# Patient Record
Sex: Female | Born: 1965 | Race: Black or African American | Hispanic: No | Marital: Married | Smoking: Never smoker
Health system: Southern US, Community
[De-identification: ages and names within clinical notes are randomized; demographics above are authoritative.]

## PROBLEM LIST (undated history)

## (undated) DIAGNOSIS — I10 Essential (primary) hypertension: Secondary | ICD-10-CM

---

## 1997-04-25 ENCOUNTER — Ambulatory Visit (HOSPITAL_COMMUNITY): Admission: RE | Admit: 1997-04-25 | Discharge: 1997-04-25 | Payer: Self-pay | Admitting: Obstetrics and Gynecology

## 1999-01-01 ENCOUNTER — Other Ambulatory Visit: Admission: RE | Admit: 1999-01-01 | Discharge: 1999-01-01 | Payer: Self-pay | Admitting: Obstetrics & Gynecology

## 1999-08-14 ENCOUNTER — Inpatient Hospital Stay (HOSPITAL_COMMUNITY): Admission: AD | Admit: 1999-08-14 | Discharge: 1999-08-17 | Payer: Self-pay | Admitting: Obstetrics & Gynecology

## 2000-03-30 ENCOUNTER — Other Ambulatory Visit: Admission: RE | Admit: 2000-03-30 | Discharge: 2000-03-30 | Payer: Self-pay | Admitting: Obstetrics and Gynecology

## 2002-11-19 ENCOUNTER — Emergency Department (HOSPITAL_COMMUNITY): Admission: EM | Admit: 2002-11-19 | Discharge: 2002-11-19 | Payer: Self-pay | Admitting: Emergency Medicine

## 2002-11-19 ENCOUNTER — Encounter: Payer: Self-pay | Admitting: Emergency Medicine

## 2004-02-21 ENCOUNTER — Other Ambulatory Visit: Admission: RE | Admit: 2004-02-21 | Discharge: 2004-02-21 | Payer: Self-pay | Admitting: Obstetrics and Gynecology

## 2004-02-21 ENCOUNTER — Other Ambulatory Visit: Admission: RE | Admit: 2004-02-21 | Discharge: 2004-02-21 | Payer: Self-pay | Admitting: Obstetrics & Gynecology

## 2004-02-22 ENCOUNTER — Ambulatory Visit (HOSPITAL_COMMUNITY): Admission: RE | Admit: 2004-02-22 | Discharge: 2004-02-22 | Payer: Self-pay | Admitting: Obstetrics & Gynecology

## 2005-05-11 ENCOUNTER — Other Ambulatory Visit: Admission: RE | Admit: 2005-05-11 | Discharge: 2005-05-11 | Payer: Self-pay | Admitting: Obstetrics & Gynecology

## 2010-07-18 ENCOUNTER — Other Ambulatory Visit: Payer: Self-pay | Admitting: Obstetrics & Gynecology

## 2012-11-30 ENCOUNTER — Other Ambulatory Visit: Payer: Self-pay

## 2017-02-01 ENCOUNTER — Encounter (HOSPITAL_COMMUNITY): Payer: Self-pay | Admitting: Family Medicine

## 2017-02-01 ENCOUNTER — Emergency Department (HOSPITAL_COMMUNITY): Payer: 59

## 2017-02-01 DIAGNOSIS — Z79899 Other long term (current) drug therapy: Secondary | ICD-10-CM | POA: Diagnosis not present

## 2017-02-01 DIAGNOSIS — I10 Essential (primary) hypertension: Secondary | ICD-10-CM | POA: Insufficient documentation

## 2017-02-01 DIAGNOSIS — M62838 Other muscle spasm: Secondary | ICD-10-CM | POA: Insufficient documentation

## 2017-02-01 DIAGNOSIS — R079 Chest pain, unspecified: Secondary | ICD-10-CM | POA: Diagnosis present

## 2017-02-01 LAB — CBC
HCT: 33.6 % — ABNORMAL LOW (ref 36.0–46.0)
HEMOGLOBIN: 11.7 g/dL — AB (ref 12.0–15.0)
MCH: 28.8 pg (ref 26.0–34.0)
MCHC: 34.8 g/dL (ref 30.0–36.0)
MCV: 82.8 fL (ref 78.0–100.0)
PLATELETS: 183 10*3/uL (ref 150–400)
RBC: 4.06 MIL/uL (ref 3.87–5.11)
RDW: 13.3 % (ref 11.5–15.5)
WBC: 4 10*3/uL (ref 4.0–10.5)

## 2017-02-01 LAB — BASIC METABOLIC PANEL
ANION GAP: 9 (ref 5–15)
BUN: 15 mg/dL (ref 6–20)
CALCIUM: 9.1 mg/dL (ref 8.9–10.3)
CO2: 24 mmol/L (ref 22–32)
CREATININE: 0.72 mg/dL (ref 0.44–1.00)
Chloride: 107 mmol/L (ref 101–111)
Glucose, Bld: 102 mg/dL — ABNORMAL HIGH (ref 65–99)
Potassium: 3.6 mmol/L (ref 3.5–5.1)
SODIUM: 140 mmol/L (ref 135–145)

## 2017-02-01 LAB — I-STAT TROPONIN, ED: TROPONIN I, POC: 0 ng/mL (ref 0.00–0.08)

## 2017-02-01 NOTE — ED Triage Notes (Signed)
Patient is experiencing mid sternal chest pain that radiates to her right shoulder and back. Associated symptoms are shortness of breath, dizziness, lightheadedness, back pain and weakness. Symptoms started last week but getting worse.

## 2017-02-02 ENCOUNTER — Emergency Department (HOSPITAL_COMMUNITY)
Admission: EM | Admit: 2017-02-02 | Discharge: 2017-02-02 | Disposition: A | Discharge status: Home / Self Care | Payer: 59 | Attending: Emergency Medicine | Admitting: Emergency Medicine

## 2017-02-02 DIAGNOSIS — M62838 Other muscle spasm: Secondary | ICD-10-CM

## 2017-02-02 HISTORY — DX: Essential (primary) hypertension: I10

## 2017-02-02 LAB — D-DIMER, QUANTITATIVE (NOT AT ARMC)

## 2017-02-02 LAB — I-STAT TROPONIN, ED: TROPONIN I, POC: 0 ng/mL (ref 0.00–0.08)

## 2017-02-02 MED ORDER — METHOCARBAMOL 500 MG PO TABS
1000.0000 mg | ORAL_TABLET | Freq: Once | ORAL | Status: AC
  Administered 2017-02-02: 1000 mg via ORAL
  Filled 2017-02-02: qty 2

## 2017-02-02 MED ORDER — METAXALONE 800 MG PO TABS: 800.0000 mg | ORAL_TABLET | Freq: Three times a day (TID) | ORAL | 0 refills | Status: AC

## 2017-02-02 MED ORDER — KETOROLAC TROMETHAMINE 30 MG/ML IJ SOLN
15.0000 mg | Freq: Once | INTRAMUSCULAR | Status: AC
  Administered 2017-02-02: 15 mg via INTRAVENOUS
  Filled 2017-02-02: qty 1

## 2017-02-02 MED ORDER — MELOXICAM 7.5 MG PO TABS: 7.5000 mg | ORAL_TABLET | Freq: Every day | ORAL | 0 refills | Status: AC

## 2017-02-02 NOTE — ED Provider Notes (Signed)
[facility]-EMERGENCY DEPT Provider Note   CSN: [id number] Arrival date & time: 02/01/17  2230     History   Chief Complaint Chief Complaint  Patient presents with  . Chest Pain  . Dizziness    HPI Gloria White is a 51 y.o. female.  The history is provided by the patient.  Chest Pain   This is a new problem. The current episode started more than 1 week ago. The problem occurs constantly. The problem has not changed since onset.The pain is associated with movement and raising an arm. The pain is present in the lateral region. The pain is moderate. The quality of the pain is described as dull. Radiates to: started in the right middle finger radiated up to the shoulder now pain in shoulder with movement now has spasmotic left pain and lifts repetitively at work.   The symptoms are aggravated by certain positions. Pertinent negatives include no abdominal pain, no diaphoresis, no exertional chest pressure, no hemoptysis, no irregular heartbeat, no leg pain, no lower extremity edema, no near-syncope, no numbness, no orthopnea, no palpitations, no shortness of breath, no sputum production, no syncope, no vomiting and no weakness. She has tried nothing for the symptoms. The treatment provided no relief. Risk factors: HTN.  Pertinent negatives for past medical history include no aneurysm.  Pertinent negatives for family medical history include: no Marfan's syndrome.  Procedure history is negative for stress echo.  Dizziness  Associated symptoms: chest pain   Associated symptoms: no palpitations, no shortness of breath, no syncope, no vomiting and no weakness     Past Medical History:  Diagnosis Date  . Hypertension     There are no active problems to display for this patient.   History reviewed. No pertinent surgical history.  OB History    No data available       Home Medications    Prior to Admission medications   Medication Sig Start Date End Date  Taking? Authorizing Provider  losartan-hydrochlorothiazide (HYZAAR) 50-12.5 MG tablet Take 1 tablet daily by mouth. 01/14/17  Yes [provider]  Vitamin D, Ergocalciferol, (DRISDOL) 50000 units CAPS capsule Take 50,000 Units every 7 (seven) days by mouth.   Yes [provider]  meloxicam (MOBIC) 7.5 MG tablet Take 1 tablet (7.5 mg total) daily by mouth. 02/02/17   Jourdyn Ferrin, MD  metaxalone (SKELAXIN) 800 MG tablet Take 1 tablet (800 mg total) 3 (three) times daily by mouth. 02/02/17   Keyli Duross, MD    Family History History reviewed. No pertinent family history.  Social History Social History   Tobacco Use  . Smoking status: Never Smoker  . Smokeless tobacco: Never Used  Substance Use Topics  . Alcohol use: No    Frequency: Never  . Drug use: No     Allergies   Patient has no known allergies.   Review of Systems Review of Systems  Constitutional: Negative for diaphoresis.  Eyes: Negative for photophobia.  Respiratory: Negative for hemoptysis, sputum production and shortness of breath.   Cardiovascular: Positive for chest pain. Negative for palpitations, orthopnea, leg swelling, syncope and near-syncope.  Gastrointestinal: Negative for abdominal pain and vomiting.  Neurological: Negative for tremors, syncope, speech difficulty, weakness and numbness.  All other systems reviewed and are negative.    Physical Exam Updated Vital Signs BP 138/77 (BP Location: Left Arm)   Pulse 70   Temp 98.4 F (36.9 C) (Oral)   Resp 19   Ht 5\' 4"  (  1.626 m)   Wt 82.6 kg (182 lb)   SpO2 98%   BMI 31.24 kg/m   Physical Exam  Constitutional: She is oriented to person, place, and time. She appears well-developed and well-nourished.  HENT:  Head: Normocephalic and atraumatic.  Mouth/Throat: No oropharyngeal exudate.  Eyes: Conjunctivae are normal. Pupils are equal, round, and reactive to light.  Neck: Normal range of motion. Neck supple. No JVD present.    Cardiovascular: Normal rate, regular rhythm, normal heart sounds and intact distal pulses.  Pulmonary/Chest: Effort normal and breath sounds normal. No stridor. No respiratory distress. She has no wheezes. She has no rales. She exhibits tenderness.  Spasm of the right shoulder and posterior lateral left chest wall  Abdominal: Soft. Bowel sounds are normal. She exhibits no mass. There is no tenderness. There is no rebound and no guarding.  Musculoskeletal: Normal range of motion.  Neurological: She is alert and oriented to person, place, and time. She displays normal reflexes.  Skin: Skin is warm and dry. Capillary refill takes less than 2 seconds. She is not diaphoretic.  Psychiatric: She has a normal mood and affect.     ED Treatments / Results   Vitals:   02/02/17 0109 02/02/17 0235  BP: (!) 172/91 138/77  Pulse: 66 70  Resp: 19 19  Temp:    SpO2: 99% 98%    Labs (all labs ordered are listed, but only abnormal results are displayed)  Results for orders placed or performed during the hospital encounter of 02/02/17  Basic metabolic panel  Result Value Ref Range   Sodium 140 135 - 145 mmol/L   Potassium 3.6 3.5 - 5.1 mmol/L   Chloride 107 101 - 111 mmol/L   CO2 24 22 - 32 mmol/L   Glucose, Bld 102 (H) 65 - 99 mg/dL   BUN 15 6 - 20 mg/dL   Creatinine, Ser 1.610.72 0.44 - 1.00 mg/dL   Calcium 9.1 8.9 - 09.610.3 mg/dL   GFR calc non Af Amer >60 >60 mL/min   GFR calc Af Amer >60 >60 mL/min   Anion gap 9 5 - 15  CBC  Result Value Ref Range   WBC 4.0 4.0 - 10.5 K/uL   RBC 4.06 3.87 - 5.11 MIL/uL   Hemoglobin 11.7 (L) 12.0 - 15.0 g/dL   HCT 04.533.6 (L) 40.936.0 - 81.146.0 %   MCV 82.8 78.0 - 100.0 fL   MCH 28.8 26.0 - 34.0 pg   MCHC 34.8 30.0 - 36.0 g/dL   RDW 91.413.3 78.211.5 - 95.615.5 %   Platelets 183 150 - 400 K/uL  D-dimer, quantitative (not at [facility])  Result Value Ref Range   D-Dimer, Quant <0.27 0.00 - 0.50 ug/mL-FEU  I-stat troponin, ED  Result Value Ref Range   Troponin i, poc 0.00 0.00 -  0.08 ng/mL   Comment 3          I-stat troponin, ED  Result Value Ref Range   Troponin i, poc 0.00 0.00 - 0.08 ng/mL   Comment 3           Dg Chest 2 View  Result Date: 02/01/2017 CLINICAL DATA:  Chest pain and right arm numbness. EXAM: CHEST  2 VIEW COMPARISON:  None. FINDINGS: The heart size and mediastinal contours are within normal limits. Both lungs are clear. The visualized skeletal structures are unremarkable. IMPRESSION: Normal chest. Electronically Signed   By: Deatra RobinsonKevin  Herman M.D.   On: 02/01/2017 22:55    EKG  EKG Interpretation  Date/Time:  Tuesday February 02 2017 01:13:36 EST Ventricular Rate:  69 PR Interval:    QRS Duration: 85 QT Interval:  422 QTC Calculation: 453 R Axis:   66 Text Interpretation:  Sinus rhythm Confirmed by Adan Baehr ([id number]) on 02/02/2017 1:49:02 AM       Radiology Dg Chest 2 View  Result Date: 02/01/2017 CLINICAL DATA:  Chest pain and right arm numbness. EXAM: CHEST  2 VIEW COMPARISON:  None. FINDINGS: The heart size and mediastinal contours are within normal limits. Both lungs are clear. The visualized skeletal structures are unremarkable. IMPRESSION: Normal chest. Electronically Signed   By: Deatra Robinson M.D.   On: 02/01/2017 22:55    Procedures Procedures (including critical care time)  Medications Ordered in ED Medications  ketorolac (TORADOL) 30 MG/ML injection 15 mg (15 mg Intravenous Given 02/02/17 0235)  methocarbamol (ROBAXIN) tablet 1,000 mg (1,000 mg Oral Given 02/02/17 0235)      Final Clinical Impressions(s) / ED Diagnoses   Final diagnoses:  Muscle spasm   All questions answered to the patient's satisfaction.    Strict return precautions for fever, global weakness, blood in the urine, abdominal distention, vomiting, no drainage from the foley catheter, swelling or the lips or tongue, chest pain, dyspnea on exertion, new weakness or numbness changes in vision or speech, fevers, weakness persistent pain,  Inability to tolerate liquids or food, changes in voice cough, altered mental status or any concerns. No signs of systemic illness or infection. The patient is nontoxic-appearing on exam and vital signs are within normal limits.    I have reviewed the triage vital signs and the nursing notes. Pertinent labs &imaging results that were available during my care of the patient were reviewed by me and considered in my medical decision making (see chart for details).  After history, exam, and medical workup I feel the patient has been appropriately medically screened and is safe for discharge home. Pertinent diagnoses were discussed with the patient. Patient was given return precautions   ED Discharge Orders        Ordered    metaxalone (SKELAXIN) 800 MG tablet  3 times daily     02/02/17 0306    meloxicam (MOBIC) 7.5 MG tablet  Daily     02/02/17 0306       Donnamaria Shands, MD 02/02/17 6045

## 2017-06-29 ENCOUNTER — Emergency Department (HOSPITAL_COMMUNITY)
Admission: EM | Admit: 2017-06-29 | Discharge: 2017-06-29 | Disposition: A | Discharge status: Home / Self Care | Payer: 59 | Attending: Emergency Medicine | Admitting: Emergency Medicine

## 2017-06-29 ENCOUNTER — Encounter (HOSPITAL_COMMUNITY): Payer: Self-pay | Admitting: Emergency Medicine

## 2017-06-29 ENCOUNTER — Emergency Department (HOSPITAL_COMMUNITY): Payer: 59

## 2017-06-29 DIAGNOSIS — R079 Chest pain, unspecified: Secondary | ICD-10-CM | POA: Diagnosis present

## 2017-06-29 DIAGNOSIS — R202 Paresthesia of skin: Secondary | ICD-10-CM | POA: Diagnosis not present

## 2017-06-29 DIAGNOSIS — R0602 Shortness of breath: Secondary | ICD-10-CM | POA: Insufficient documentation

## 2017-06-29 DIAGNOSIS — M25511 Pain in right shoulder: Secondary | ICD-10-CM

## 2017-06-29 DIAGNOSIS — Z79899 Other long term (current) drug therapy: Secondary | ICD-10-CM | POA: Insufficient documentation

## 2017-06-29 DIAGNOSIS — I10 Essential (primary) hypertension: Secondary | ICD-10-CM | POA: Insufficient documentation

## 2017-06-29 DIAGNOSIS — R42 Dizziness and giddiness: Secondary | ICD-10-CM | POA: Insufficient documentation

## 2017-06-29 DIAGNOSIS — M25512 Pain in left shoulder: Secondary | ICD-10-CM | POA: Insufficient documentation

## 2017-06-29 DIAGNOSIS — D649 Anemia, unspecified: Secondary | ICD-10-CM | POA: Insufficient documentation

## 2017-06-29 LAB — CBC
HCT: 33 % — ABNORMAL LOW (ref 36.0–46.0)
Hemoglobin: 11 g/dL — ABNORMAL LOW (ref 12.0–15.0)
MCH: 27.6 pg (ref 26.0–34.0)
MCHC: 33.3 g/dL (ref 30.0–36.0)
MCV: 82.9 fL (ref 78.0–100.0)
PLATELETS: 176 10*3/uL (ref 150–400)
RBC: 3.98 MIL/uL (ref 3.87–5.11)
RDW: 13.5 % (ref 11.5–15.5)
WBC: 3.5 10*3/uL — ABNORMAL LOW (ref 4.0–10.5)

## 2017-06-29 LAB — BASIC METABOLIC PANEL
Anion gap: 9 (ref 5–15)
BUN: 11 mg/dL (ref 6–20)
CALCIUM: 8.9 mg/dL (ref 8.9–10.3)
CO2: 22 mmol/L (ref 22–32)
CREATININE: 0.83 mg/dL (ref 0.44–1.00)
Chloride: 109 mmol/L (ref 101–111)
GFR calc Af Amer: >60 mL/min (ref >60)
GFR calc non Af Amer: >60 mL/min (ref >60)
GLUCOSE: 106 mg/dL — AB (ref 65–99)
Potassium: 3.7 mmol/L (ref 3.5–5.1)
Sodium: 140 mmol/L (ref 135–145)

## 2017-06-29 LAB — I-STAT BETA HCG BLOOD, ED (MC, WL, AP ONLY): I-stat hCG, quantitative: <5 m[IU]/mL (ref <5)

## 2017-06-29 LAB — I-STAT TROPONIN, ED
TROPONIN I, POC: 0 ng/mL (ref 0.00–0.08)
TROPONIN I, POC: 0 ng/mL (ref 0.00–0.08)

## 2017-06-29 LAB — D-DIMER, QUANTITATIVE (NOT AT ARMC): D DIMER QUANT: 0.41 ug{FEU}/mL (ref 0.00–0.50)

## 2017-06-29 MED ORDER — IBUPROFEN 800 MG PO TABS
800.0000 mg | ORAL_TABLET | Freq: Once | ORAL | Status: AC
Start: 2017-06-29 — End: 2017-06-29
  Administered 2017-06-29: 800 mg via ORAL
  Filled 2017-06-29: qty 1

## 2017-06-29 MED ORDER — ACETAMINOPHEN 325 MG PO TABS
650.0000 mg | ORAL_TABLET | Freq: Once | ORAL | Status: AC
  Administered 2017-06-29: 650 mg via ORAL
  Filled 2017-06-29: qty 2

## 2017-06-29 NOTE — ED Triage Notes (Signed)
Pt states she started feeling "swimmy headed around 9:30 this morning. Pt checked her BP and it was 177/100. Pt states at 3pm she started having CP, SOB, right elbow to shoulder pain and tingling. Pt then started having right arm pain and tingling. Bilateral grips weak, no drift noted. Pain when pt tries to raise arms. Pt states she also has had double vision.

## 2017-06-29 NOTE — ED Provider Notes (Signed)
[facility] EMERGENCY DEPARTMENT Provider Note   CSN: [id number] Arrival date & time: 06/29/17  1545     History   Chief Complaint Chief Complaint  Patient presents with  . Stroke Symptoms  . Chest Pain    HPI Gloria White is a 52 y.o. female.  HPI 52 year old female history of hypertension presents today stating that she felt somewhat "s describes the pain is having some tingling wimmy headed this morning".  She describes it as feeling slightly off balance but not have anything span.  She was somewhat lightheaded but has not had any syncope.  She began having some right lateral chest pain today with some shortness of breath, left shoulder pain and right shoulder pain that then radiated to the right elbow.  She describes this as tenderness to palpation in the left anterior shoulder and pain with movement of the right elbow and right shoulder.  She describes the pain is having some associated tingling with it.  She is not having any numbness or lateralized weakness.  She denies any history of coronary artery disease, cardiac chest pain in the past, DVT, or PE. Past Medical History:  Diagnosis Date  . Hypertension     There are no active problems to display for this patient.   History reviewed. No pertinent surgical history.   OB History   None      Home Medications    Prior to Admission medications   Medication Sig Start Date End Date Taking? Authorizing Provider  losartan-hydrochlorothiazide (HYZAAR) 50-12.5 MG tablet Take 1 tablet daily by mouth. 01/14/17  Yes [provider]  meloxicam (MOBIC) 7.5 MG tablet Take 1 tablet (7.5 mg total) daily by mouth. Patient not taking: Reported on 06/29/2017 02/02/17   Palumbo, April, MD  metaxalone (SKELAXIN) 800 MG tablet Take 1 tablet (800 mg total) 3 (three) times daily by mouth. Patient not taking: Reported on 06/29/2017 02/02/17   Cy Blamer, MD    Family History History reviewed. No pertinent  family history.  Social History Social History   Tobacco Use  . Smoking status: Never Smoker  . Smokeless tobacco: Never Used  Substance Use Topics  . Alcohol use: No    Frequency: Never  . Drug use: No     Allergies   Patient has no known allergies.   Review of Systems Review of Systems   Physical Exam Updated Vital Signs BP (!) 154/86   Pulse 72   Temp 99.1 F (37.3 C) (Oral)   Resp (!) 23   SpO2 100%   Physical Exam  Constitutional: She is oriented to person, place, and time. She appears well-developed and well-nourished.  HENT:  Head: Normocephalic.  Eyes: Pupils are equal, round, and reactive to light.  Neck: Normal range of motion.  Cardiovascular: Normal rate, regular rhythm, intact distal pulses and normal pulses.  Pulmonary/Chest: Effort normal and breath sounds normal.  Abdominal: Soft. Bowel sounds are normal.  Musculoskeletal:  Some ttp right shoulder and left elbow movement- no redness swelling or warmth  Neurological: She is alert and oriented to person, place, and time.  Skin: Skin is warm and dry. Capillary refill takes less than 2 seconds.  Psychiatric: She has a normal mood and affect. Her behavior is normal.  Nursing note and vitals reviewed.    ED Treatments / Results  Labs (all labs ordered are listed, but only abnormal results are displayed) Labs Reviewed  BASIC METABOLIC PANEL - Abnormal; Notable for the following components:  Result Value   Glucose, Bld 106 (*)    All other components within normal limits  CBC - Abnormal; Notable for the following components:   WBC 3.5 (*)    Hemoglobin 11.0 (*)    HCT 33.0 (*)    All other components within normal limits  D-DIMER, QUANTITATIVE (NOT AT [facility])  I-STAT TROPONIN, ED  I-STAT BETA HCG BLOOD, ED ([facility], [facility], [facility] ONLY)  I-STAT TROPONIN, ED    EKG EKG Interpretation  Date/Time:  Tuesday June 29 2017 15:50:37 EDT Ventricular Rate:  90 PR Interval:  170 QRS Duration: 68 QT  Interval:  378 QTC Calculation: 462 R Axis:   31 Text Interpretation:  Normal sinus rhythm Cannot rule out Anterior infarct , age undetermined Abnormal ECG Confirmed by Margarita Grizzleay, Roberta Kelly [phone]([id number]) on 06/29/2017 7:02:27 PM   Radiology Dg Chest 2 View  Result Date: 06/29/2017 CLINICAL DATA:  Chest pain EXAM: CHEST - 2 VIEW COMPARISON:  02/01/2017 FINDINGS: The heart size and mediastinal contours are within normal limits. Both lungs are clear. The visualized skeletal structures are unremarkable. IMPRESSION: No acute abnormality noted. Electronically Signed   By: Alcide CleverMark  Lukens M.D.   On: 06/29/2017 16:30    Procedures Procedures (including critical care time)  Medications Ordered in ED Medications  acetaminophen (TYLENOL) tablet 650 mg (650 mg Oral Given 06/29/17 2008)  ibuprofen (ADVIL,MOTRIN) tablet 800 mg (800 mg Oral Given 06/29/17 2008)     Initial Impression / Assessment and Plan / ED Course  I have reviewed the triage vital signs and the nursing notes.  Pertinent labs & imaging results that were available during my care of the patient were reviewed by me and considered in my medical decision making (see chart for details). Vitals:   06/29/17 1945 06/29/17 2000  BP: (!) 155/83 (!) 154/86  Pulse: 75 72  Resp: 16 (!) 23  Temp:    SpO2: 100% 100%   Results for orders placed or performed during the hospital encounter of 06/29/17  Basic metabolic panel  Result Value Ref Range   Sodium 140 135 - 145 mmol/L   Potassium 3.7 3.5 - 5.1 mmol/L   Chloride 109 101 - 111 mmol/L   CO2 22 22 - 32 mmol/L   Glucose, Bld 106 (H) 65 - 99 mg/dL   BUN 11 6 - 20 mg/dL   Creatinine, Ser 4.090.83 0.44 - 1.00 mg/dL   Calcium 8.9 8.9 - 81.110.3 mg/dL   GFR calc non Af Amer >60 >60 mL/min   GFR calc Af Amer >60 >60 mL/min   Anion gap 9 5 - 15  CBC  Result Value Ref Range   WBC 3.5 (L) 4.0 - 10.5 K/uL   RBC 3.98 3.87 - 5.11 MIL/uL   Hemoglobin 11.0 (L) 12.0 - 15.0 g/dL   HCT 91.433.0 (L) 78.236.0 - 95.646.0 %   MCV  82.9 78.0 - 100.0 fL   MCH 27.6 26.0 - 34.0 pg   MCHC 33.3 30.0 - 36.0 g/dL   RDW 21.313.5 08.611.5 - 57.815.5 %   Platelets 176 150 - 400 K/uL  D-dimer, quantitative (not at [facility])  Result Value Ref Range   D-Dimer, Quant 0.41 0.00 - 0.50 ug/mL-FEU  I-stat troponin, ED  Result Value Ref Range   Troponin i, poc 0.00 0.00 - 0.08 ng/mL   Comment 3          I-Stat beta hCG blood, ED  Result Value Ref Range   I-stat hCG, quantitative <5.0 <5 mIU/mL  Comment 3          I-stat troponin, ED  Result Value Ref Range   Troponin i, poc 0.00 0.00 - 0.08 ng/mL   Comment 3               Patient with multiple complaints today- lightheaded, sharp chest wall pain, shoulder and elbow joint pain.  NO evidence of cardiac ischemia with no acute changes on ekg, troponin and repeat troponin normal.  No evidence of pe- some pleuritic pain but normal d-dimer. Musculoskeletal joint pain,but no evidence of infection or inflammation on exam.  Mild anemia noted on labs which were reviewed and otherwise normal discussed results with patient.  Discussed return precautions and need for follow-up and she voices understanding.  Final Clinical Impressions(s) / ED Diagnoses   Final diagnoses:  Chest pain, unspecified type  Bilateral shoulder pain, unspecified chronicity    ED Discharge Orders    None       Margarita Grizzle, MD 06/29/17 2027

## 2017-06-29 NOTE — ED Provider Notes (Signed)
Patient placed in Quick Look pathway, seen and evaluated   Chief Complaint: CP, SOB, BUE numbness  HPI:   52 y.o. F who presents for evaluation of chest pain, shortness of breath, bilateral upper extremity numbness and dizziness.  Patient reports that this morning at 9 AM, she felt "swimmy headed.".  Patient reports that later on, she started experiencing numbness to bilateral upper extremities.  Patient reports the numbness is worse at the right elbow.  Patient reports that a few hours prior to ED arrival, she started developing some chest pain, difficulty breathing.  Patient reports that she felt diaphoretic with the chest pain.  Does not recall if it was worse with exertion or deep inspiration.  Patient reports she is on a current smoker.  Denies any cocaine, heroin or marijuana use.  Patient denies any vision changes, difficulty speaking, facial asymmetry.  ROS: CP, SOB, numbness   Physical Exam:   Gen: No distress  Neuro: Awake and Alert  Skin: Warm  Card: RRR  Pulm: Lungs clear to auscultation bilaterally.    Focused Exam:   Cranial nerves III-XII intact Follows commands, Moves all extremities  5/5 strength to BUE and BLE  Decreased sensation to right elbow.  Normal finger to nose. No dysdiadochokinesia. No pronator drift. No gait abnormalities  No slurred speech. No facial droop.   Given presentation of symptoms, timeframe, duration of symptoms, does not meet code stroke criteria.     Initiation of care has begun. The patient has been counseled on the process, plan, and necessity for staying for the completion/evaluation, and the remainder of the medical screening examination    Gloria White 06/29/17 1655    Wynetta Fines, MD 06/30/17 1302

## 2017-06-29 NOTE — Discharge Instructions (Addendum)
Your evaluation here appears normal with no evidence of heart because of pain or blood clots noted. However, you should follow-up closely with your doctor, and if pain symptoms worsen, please return for reevaluation

## 2018-03-22 IMAGING — CR DG CHEST 2V
2 series · 2 of 2 positions shown · non-contrast
Comparison: None.

CLINICAL DATA: Chest pain and right arm numbness.

EXAM:
CHEST  2 VIEW

[w chest pa]
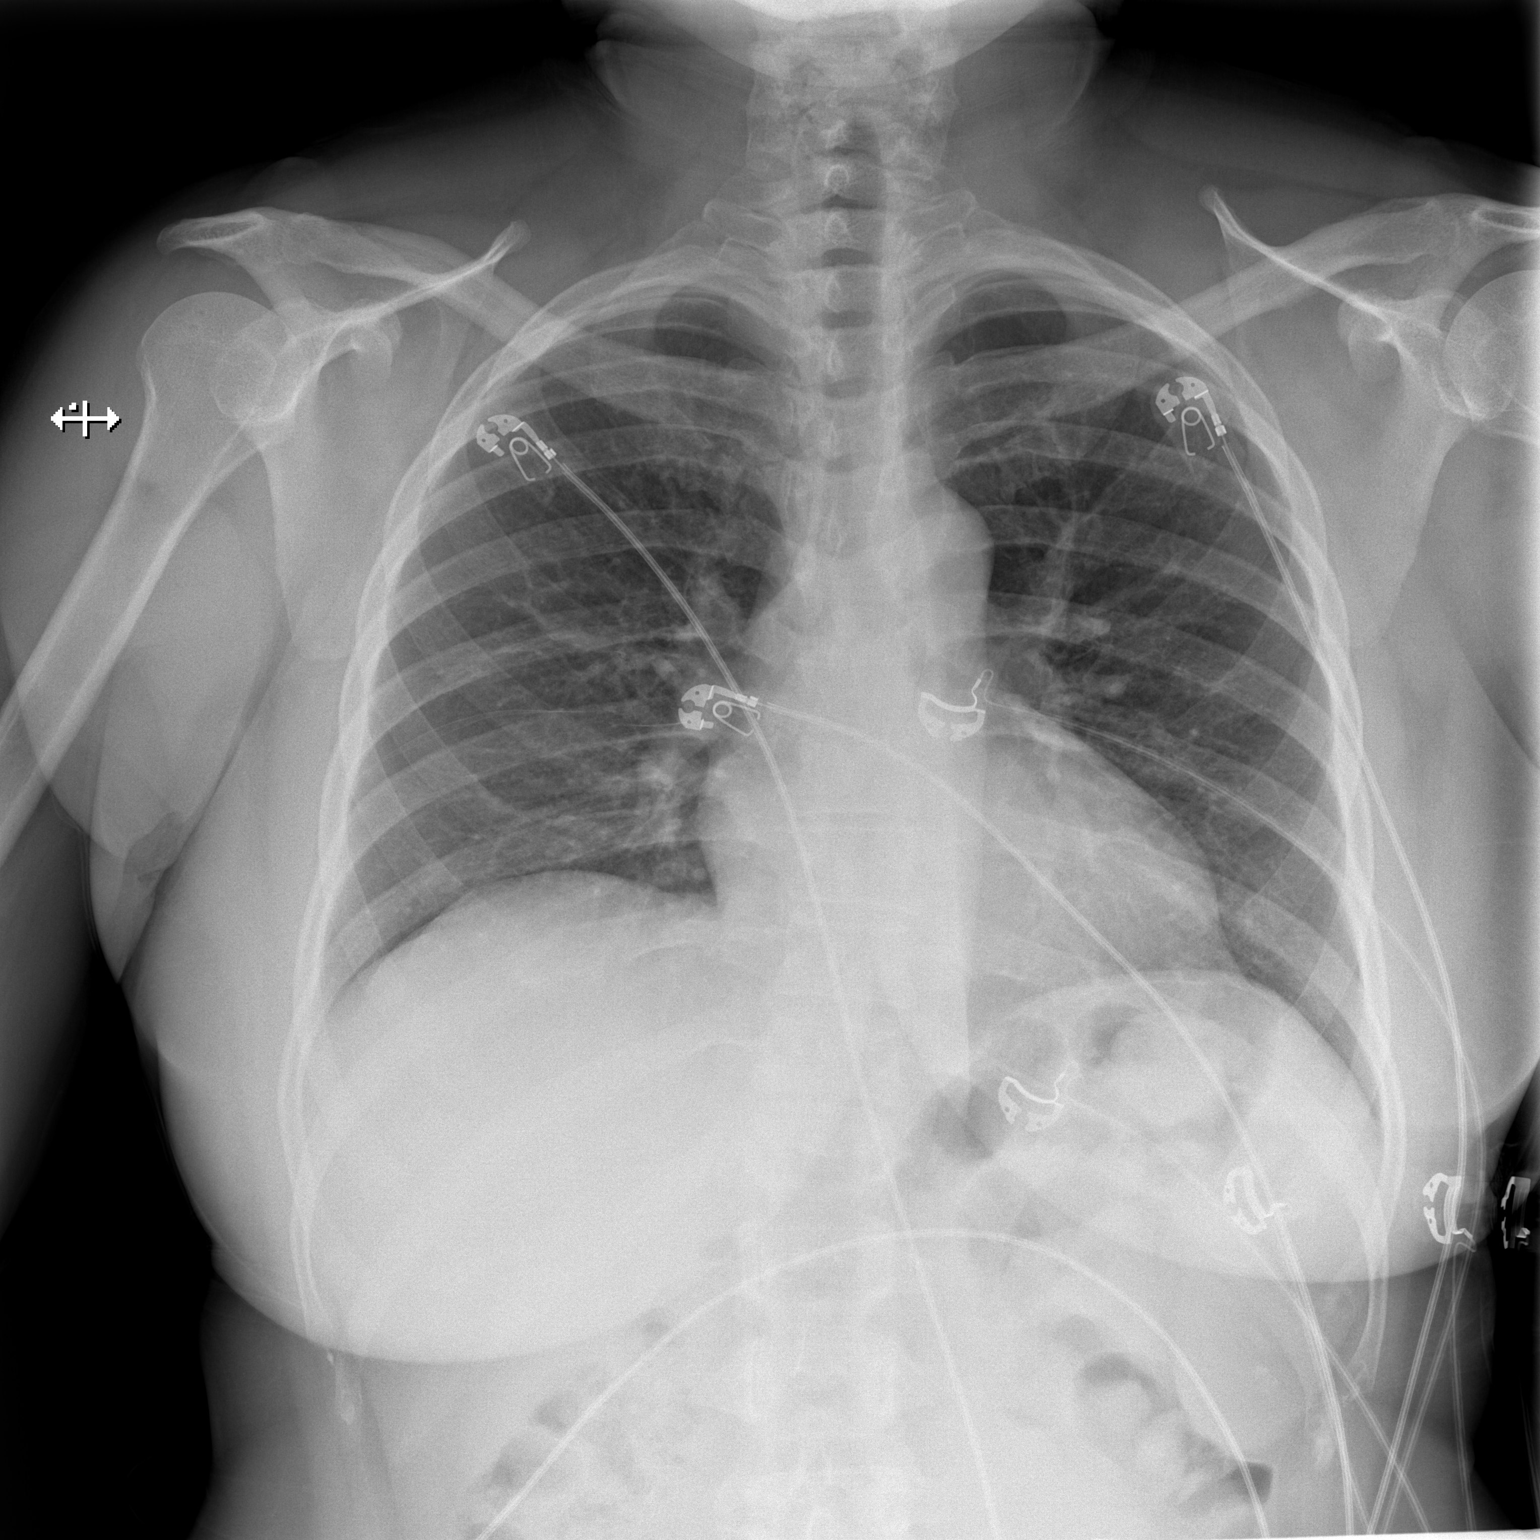

[w chest lat]
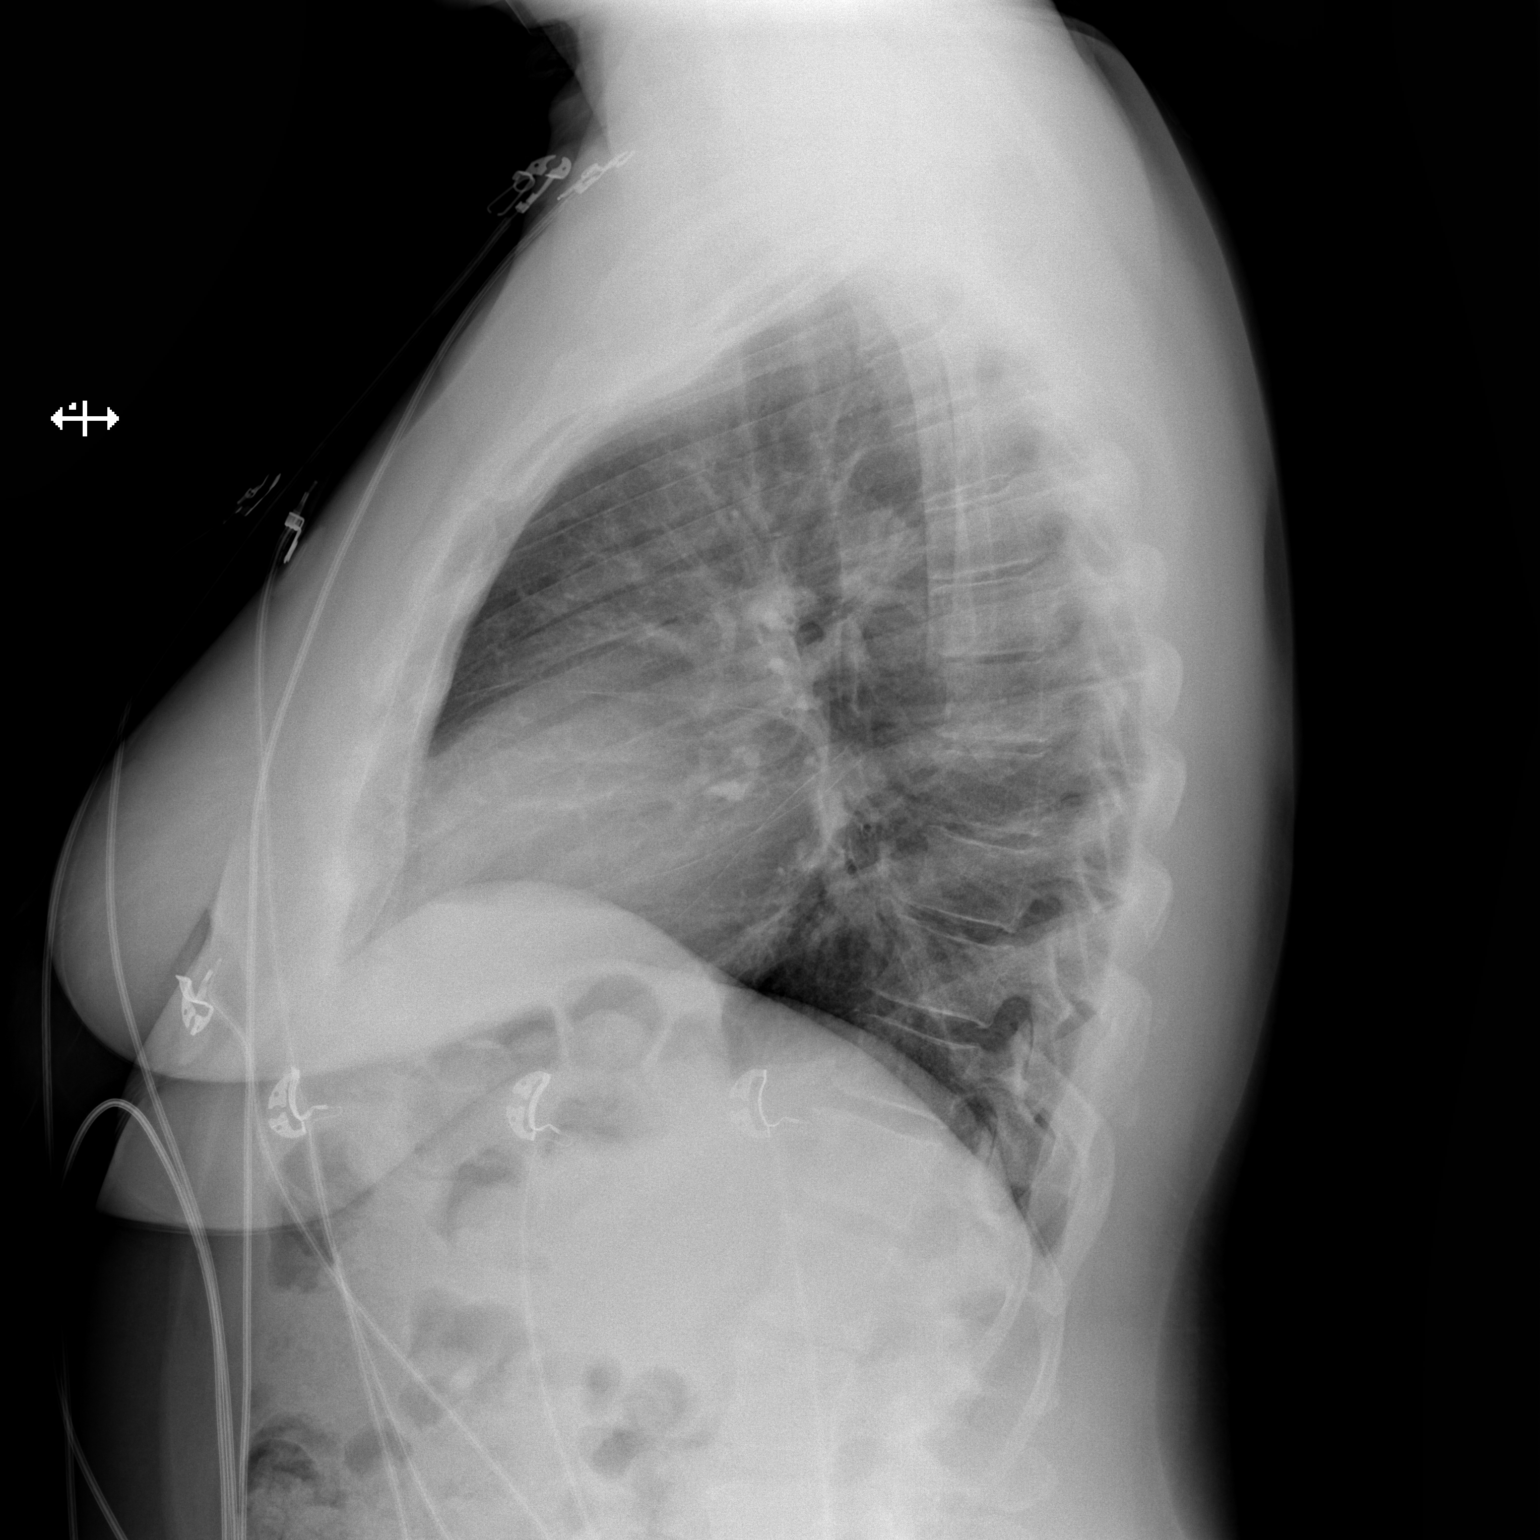

[2 of 2 positions shown; findings below may reference images not displayed]

FINDINGS: The heart size and mediastinal contours are within normal limits.
Both lungs are clear. The visualized skeletal structures are
unremarkable.
IMPRESSION: Normal chest.

## 2018-08-17 IMAGING — DX DG CHEST 2V
2 series · 2 of 2 positions shown · non-contrast
Comparison: 02/01/2017

CLINICAL DATA: Chest pain

EXAM:
CHEST - 2 VIEW

[chest pa]
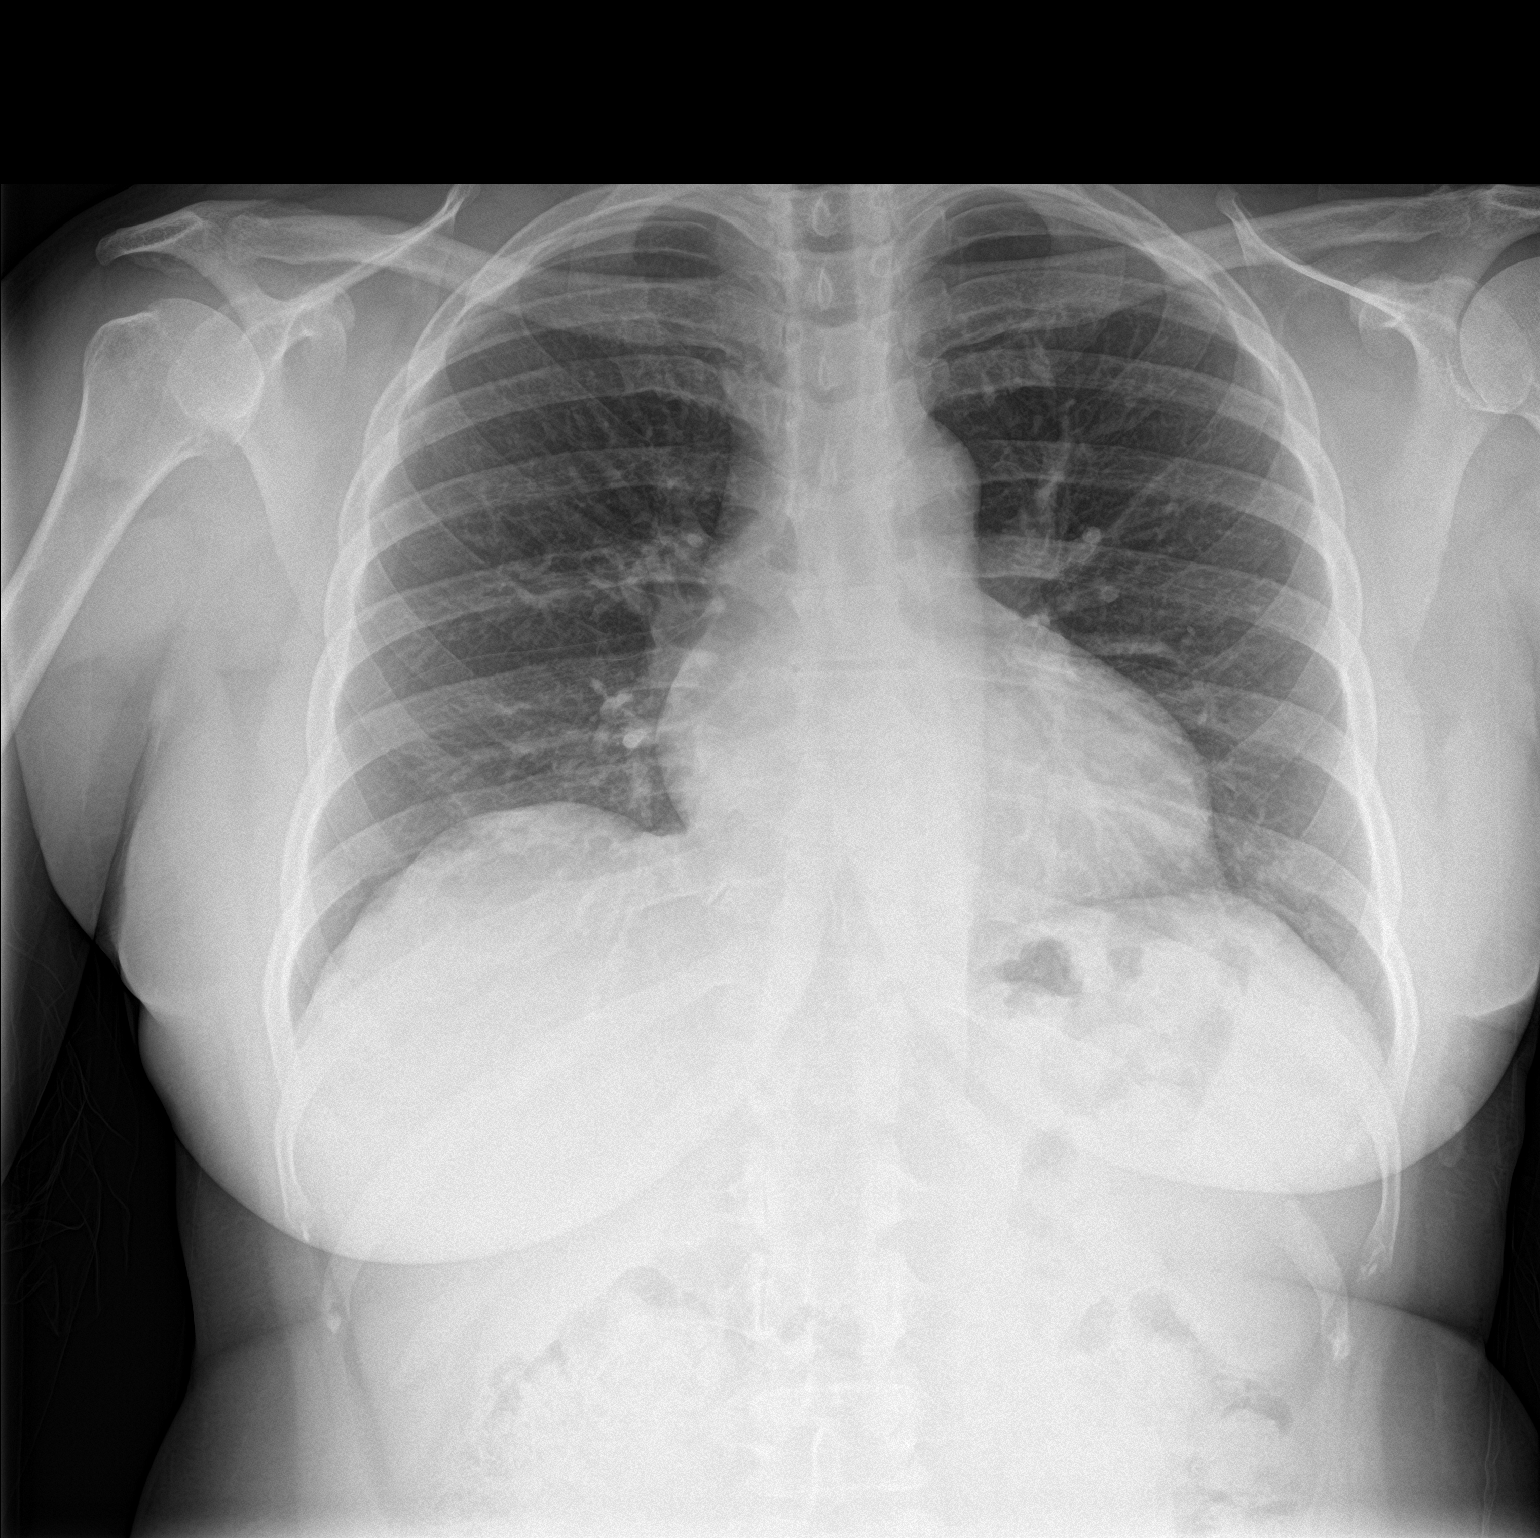

[chest lat]
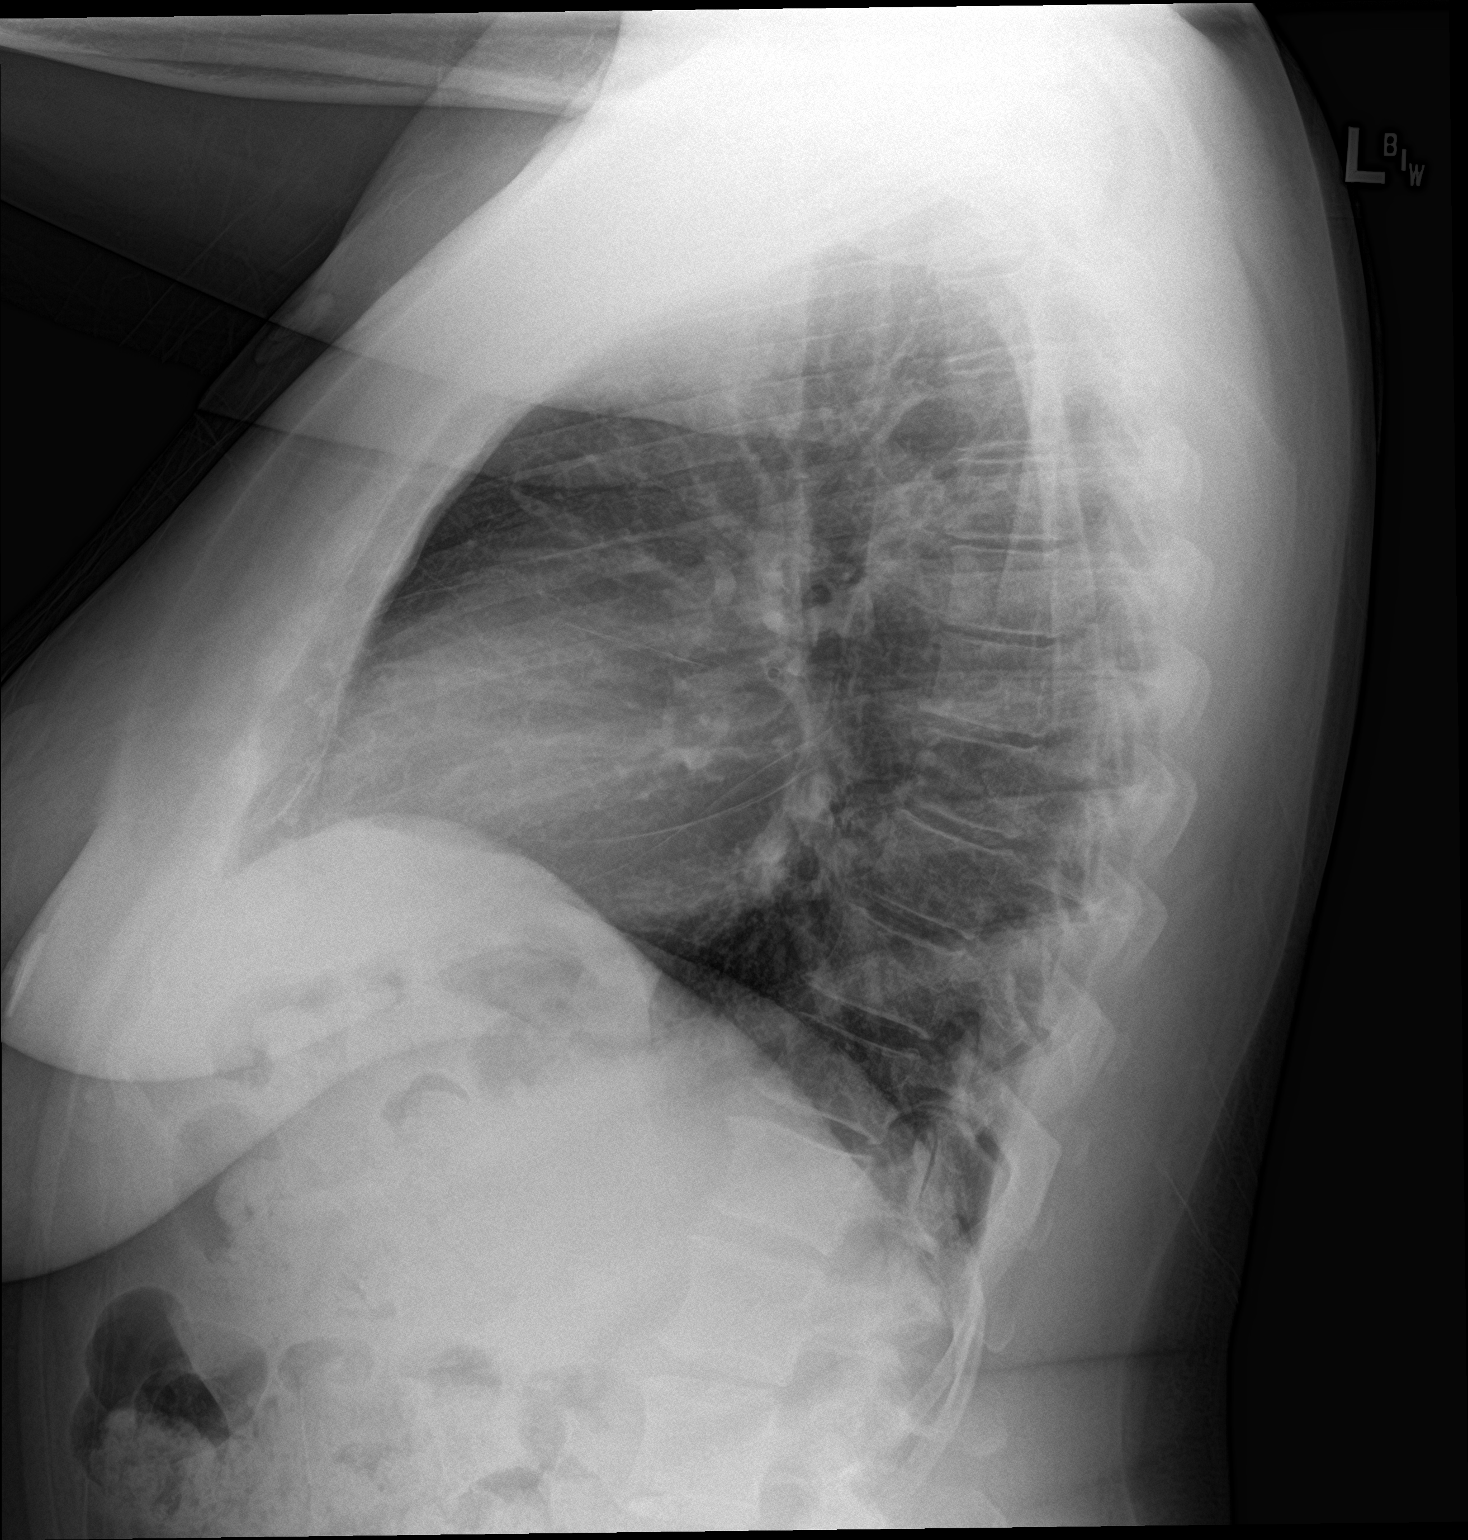

[2 of 2 positions shown; findings below may reference images not displayed]

FINDINGS: The heart size and mediastinal contours are within normal limits.
Both lungs are clear. The visualized skeletal structures are
unremarkable.
IMPRESSION: No acute abnormality noted.

## 2023-07-24 ENCOUNTER — Encounter (HOSPITAL_BASED_OUTPATIENT_CLINIC_OR_DEPARTMENT_OTHER): Payer: Self-pay | Admitting: Emergency Medicine

## 2023-07-24 ENCOUNTER — Other Ambulatory Visit: Payer: Self-pay

## 2023-07-24 ENCOUNTER — Emergency Department (HOSPITAL_BASED_OUTPATIENT_CLINIC_OR_DEPARTMENT_OTHER)

## 2023-07-24 ENCOUNTER — Emergency Department (HOSPITAL_BASED_OUTPATIENT_CLINIC_OR_DEPARTMENT_OTHER)
Admission: EM | Admit: 2023-07-24 | Discharge: 2023-07-24 | Disposition: A | Discharge status: Home / Self Care | Attending: Emergency Medicine | Admitting: Emergency Medicine

## 2023-07-24 DIAGNOSIS — R197 Diarrhea, unspecified: Secondary | ICD-10-CM | POA: Insufficient documentation

## 2023-07-24 DIAGNOSIS — J0100 Acute maxillary sinusitis, unspecified: Secondary | ICD-10-CM | POA: Insufficient documentation

## 2023-07-24 DIAGNOSIS — Z79899 Other long term (current) drug therapy: Secondary | ICD-10-CM | POA: Insufficient documentation

## 2023-07-24 DIAGNOSIS — I10 Essential (primary) hypertension: Secondary | ICD-10-CM | POA: Diagnosis not present

## 2023-07-24 DIAGNOSIS — R059 Cough, unspecified: Secondary | ICD-10-CM | POA: Diagnosis present

## 2023-07-24 LAB — BASIC METABOLIC PANEL WITH GFR
Anion gap: 14 (ref 5–15)
BUN: 11 mg/dL (ref 6–20)
CO2: 23 mmol/L (ref 22–32)
Calcium: 9.9 mg/dL (ref 8.9–10.3)
Chloride: 102 mmol/L (ref 98–111)
Creatinine, Ser: 0.98 mg/dL (ref 0.44–1.00)
GFR, Estimated: >60 mL/min (ref >60)
Glucose, Bld: 96 mg/dL (ref 70–99)
Potassium: 3.9 mmol/L (ref 3.5–5.1)
Sodium: 139 mmol/L (ref 135–145)

## 2023-07-24 LAB — CBC WITH DIFFERENTIAL/PLATELET
Abs Immature Granulocytes: 0.03 10*3/uL (ref 0.00–0.07)
Basophils Absolute: 0 10*3/uL (ref 0.0–0.1)
Basophils Relative: 0 %
Eosinophils Absolute: 0.1 10*3/uL (ref 0.0–0.5)
Eosinophils Relative: 1 %
HCT: 35.8 % — ABNORMAL LOW (ref 36.0–46.0)
Hemoglobin: 12 g/dL (ref 12.0–15.0)
Immature Granulocytes: 0 %
Lymphocytes Relative: 19 %
Lymphs Abs: 1.7 10*3/uL (ref 0.7–4.0)
MCH: 27.3 pg (ref 26.0–34.0)
MCHC: 33.5 g/dL (ref 30.0–36.0)
MCV: 81.5 fL (ref 80.0–100.0)
Monocytes Absolute: 0.9 10*3/uL (ref 0.1–1.0)
Monocytes Relative: 10 %
Neutro Abs: 6.3 10*3/uL (ref 1.7–7.7)
Neutrophils Relative %: 70 %
Platelets: 233 10*3/uL (ref 150–400)
RBC: 4.39 MIL/uL (ref 3.87–5.11)
RDW: 13.2 % (ref 11.5–15.5)
WBC: 9 10*3/uL (ref 4.0–10.5)
nRBC: 0 % (ref 0.0–0.2)

## 2023-07-24 LAB — RESP PANEL BY RT-PCR (RSV, FLU A&B, COVID)  RVPGX2
Influenza A by PCR: NEGATIVE
Influenza B by PCR: NEGATIVE
Resp Syncytial Virus by PCR: NEGATIVE
SARS Coronavirus 2 by RT PCR: NEGATIVE

## 2023-07-24 MED ORDER — AMOXICILLIN-POT CLAVULANATE 875-125 MG PO TABS: 1.0000 | ORAL_TABLET | Freq: Two times a day (BID) | ORAL | 0 refills | Status: AC

## 2023-07-24 MED ORDER — LOSARTAN POTASSIUM-HCTZ 50-12.5 MG PO TABS: 1.0000 | ORAL_TABLET | Freq: Every day | ORAL | 0 refills | Status: AC

## 2023-07-24 MED ORDER — ACETAMINOPHEN 500 MG PO TABS
1000.0000 mg | ORAL_TABLET | Freq: Once | ORAL | Status: AC
  Administered 2023-07-24: 1000 mg via ORAL
  Filled 2023-07-24: qty 2

## 2023-07-24 NOTE — ED Provider Notes (Signed)
 [facility] Provider Note  CSN: [id number] Arrival date & time: 07/24/23 1730  Chief Complaint(s) No chief complaint on file.  HPI Gloria White is a 58 y.o. female history of hypertension presenting to the emergency department with congestion.  Patient reports around 2 weeks of congestion, also having some facial pressure.  Reports yellow nasal discharge.  Also having some cough.  She reports some subjective fevers and chills.  She also reports some intermittent diarrhea.  No abdominal pain.  No blood in her stool.  Reports the cough is productive.  No shortness of breath.  No chest pain.   Past Medical History Past Medical History:  Diagnosis Date   Hypertension    There are no active problems to display for this patient.  Home Medication(s) Prior to Admission medications   Medication Sig Start Date End Date Taking? Authorizing Provider  amoxicillin-clavulanate (AUGMENTIN) 875-125 MG tablet Take 1 tablet by mouth every 12 (twelve) hours for 10 days. 07/24/23 08/03/23 Yes Mordecai Applebaum, MD  losartan-hydrochlorothiazide (HYZAAR) 50-12.5 MG tablet Take 1 tablet by mouth daily. 07/24/23   Mordecai Applebaum, MD  meloxicam  (MOBIC ) 7.5 MG tablet Take 1 tablet (7.5 mg total) daily by mouth. Patient not taking: Reported on 06/29/2017 02/02/17   Palumbo, April, MD  metaxalone  (SKELAXIN ) 800 MG tablet Take 1 tablet (800 mg total) 3 (three) times daily by mouth. Patient not taking: Reported on 06/29/2017 02/02/17   Tonya Fredrickson, MD                                                                                                                                    Past Surgical History History reviewed. No pertinent surgical history. Family History History reviewed. No pertinent family history.  Social History Social History   Tobacco Use   Smoking status: Never   Smokeless tobacco: Never  Vaping Use   Vaping status: Never Used   Substance Use Topics   Alcohol use: No   Drug use: No   Allergies Patient has no known allergies.  Review of Systems Review of Systems  All other systems reviewed and are negative.   Physical Exam Vital Signs  I have reviewed the triage vital signs BP (!) 176/112 (BP Location: Right Arm)   Pulse 100   Temp 99.6 F (37.6 C) (Oral)   Resp 17   Ht 5\' 4"  (1.626 m)   Wt 82.1 kg   SpO2 100%   BMI 31.07 kg/m  Physical Exam Vitals and nursing note reviewed.  Constitutional:      General: She is not in acute distress.    Appearance: She is well-developed.  HENT:     Head: Normocephalic and atraumatic.     Comments: Mild tenderness to percussion over maxillary sinus bilaterally    Nose: Congestion and rhinorrhea present.     Mouth/Throat:     Mouth: Mucous membranes  are moist.  Eyes:     Pupils: Pupils are equal, round, and reactive to light.  Cardiovascular:     Rate and Rhythm: Normal rate and regular rhythm.     Heart sounds: No murmur heard. Pulmonary:     Effort: Pulmonary effort is normal. No respiratory distress.     Breath sounds: Normal breath sounds.  Abdominal:     General: Abdomen is flat.     Palpations: Abdomen is soft.     Tenderness: There is no abdominal tenderness.  Musculoskeletal:        General: No tenderness.     Right lower leg: No edema.     Left lower leg: No edema.  Skin:    General: Skin is warm and dry.  Neurological:     General: No focal deficit present.     Mental Status: She is alert. Mental status is at baseline.  Psychiatric:        Mood and Affect: Mood normal.        Behavior: Behavior normal.     ED Results and Treatments Labs (all labs ordered are listed, but only abnormal results are displayed) Labs Reviewed  CBC WITH DIFFERENTIAL/PLATELET - Abnormal; Notable for the following components:      Result Value   HCT 35.8 (*)    All other components within normal limits  RESP PANEL BY RT-PCR (RSV, FLU A&B, COVID)  RVPGX2   BASIC METABOLIC PANEL WITH GFR                                                                                                                          Radiology No results found.  Pertinent labs & imaging results that were available during my care of the patient were reviewed by me and considered in my medical decision making (see MDM for details).  Medications Ordered in ED Medications  acetaminophen  (TYLENOL ) tablet 1,000 mg (1,000 mg Oral Given 07/24/23 1848)                                                                                                                                     Procedures Procedures  (including critical care time)  Medical Decision Making / ED Course   MDM:  58 year old presenting to the emergency department with congestion, cough.  Patient overall well-appearing, vitals with no fever, tachycardia.  Does have some maxillary tenderness to  percussion and discharge.  Suspect sinus infection.  She has been having symptoms for about 2 weeks.  Given persistence, would treat with antibiotics.  No focal findings on pulmonary exam but given productive cough as well, will check chest x-ray and some basic labs.  Flu, COVID, RSV testing is negative.  Lower concern for viral cause of symptoms with 2 weeks of symptoms.  Patient is overall very well-appearing.  Clinical Course as of 07/24/23 1955  Sat Jul 24, 2023  1954 Workup reassuring.  Labs reassuring.  Chest x-ray on my interpretation with no evidence of pneumothorax, pneumonia or focal infiltrate. Will discharge patient to home. All questions answered. Patient comfortable with plan of discharge. Return precautions discussed with patient and specified on the after visit summary.  [WS]    Clinical Course User Index [WS] Mordecai Applebaum, MD     Additional history obtained: -Additional history obtained from family    Lab Tests: -I ordered, reviewed, and interpreted labs.   The pertinent results  include:   Labs Reviewed  CBC WITH DIFFERENTIAL/PLATELET - Abnormal; Notable for the following components:      Result Value   HCT 35.8 (*)    All other components within normal limits  RESP PANEL BY RT-PCR (RSV, FLU A&B, COVID)  RVPGX2  BASIC METABOLIC PANEL WITH GFR    Notable for no acute process   Imaging Studies ordered: I ordered imaging studies including CXR On my interpretation imaging demonstrates no acute process   Medicines ordered and prescription drug management: Meds ordered this encounter  Medications   acetaminophen  (TYLENOL ) tablet 1,000 mg   amoxicillin-clavulanate (AUGMENTIN) 875-125 MG tablet    Sig: Take 1 tablet by mouth every 12 (twelve) hours for 10 days.    Dispense:  20 tablet    Refill:  0   losartan-hydrochlorothiazide (HYZAAR) 50-12.5 MG tablet    Sig: Take 1 tablet by mouth daily.    Dispense:  30 tablet    Refill:  0    -I have reviewed the patients home medicines and have made adjustments as needed   Social Determinants of Health:  Diagnosis or treatment significantly limited by social determinants of health: obesity   Reevaluation: After the interventions noted above, I reevaluated the patient and found that their symptoms have improved  Co morbidities that complicate the patient evaluation  Past Medical History:  Diagnosis Date   Hypertension       Dispostion: Disposition decision including need for hospitalization was considered, and patient discharged from emergency department.    Final Clinical Impression(s) / ED Diagnoses Final diagnoses:  Acute maxillary sinusitis, recurrence not specified     This chart was dictated using voice recognition software.  Despite best efforts to proofread,  errors can occur which can change the documentation meaning.    Mordecai Applebaum, MD 07/24/23 Gerry Krone

## 2023-07-24 NOTE — ED Notes (Signed)

## 2023-07-24 NOTE — ED Triage Notes (Signed)
 Pt c/o productive cough x a couple of weeks; nasal congestion noted;  reports intermittent fever, diarrhea, HA; BP noted to be elevated; sts she is out of HTN meds for weeks

## 2023-07-24 NOTE — Discharge Instructions (Addendum)
 We evaluated you for your congestion and cough.  Your symptoms are most likely due to a sinus infection.  Your testing was reassuring.  We believe it is safe to go home.  Please take Tylenol  (acetaminophen ) and Motrin  (ibuprofen ) for your symptoms at home.  You can take 1000 mg of Tylenol  every 6 hours and 600 mg of Motrin  every 6 hours as needed for your symptoms.  You can take these medicines together as needed, either at the same time, or alternating every 3 hours.  You can also try sinus rinses to help with your symptoms.  We have prescribed you an antibiotic.  Please take this as prescribed.  I have also sent a refill of your blood pressure medication.  Please follow-up closely with your primary doctor.  Please return to the emergency department if you have any worsening symptoms or new symptoms like difficulty breathing, trouble swallowing, severe pain, or any other concerning symptoms.
